# Patient Record
Sex: Female | Born: 1955 | Race: White | Hispanic: No | Marital: Single | Smoking: Never smoker
Health system: Southern US, Community
[De-identification: ages and names within clinical notes are randomized; demographics above are authoritative.]

## PROBLEM LIST (undated history)

## (undated) DIAGNOSIS — F32A Depression, unspecified: Secondary | ICD-10-CM

## (undated) DIAGNOSIS — F329 Major depressive disorder, single episode, unspecified: Secondary | ICD-10-CM

## (undated) HISTORY — PX: BREAST EXCISIONAL BIOPSY: SUR124

## (undated) HISTORY — PX: ABDOMINAL HYSTERECTOMY: SHX81

## (undated) HISTORY — PX: AUGMENTATION MAMMAPLASTY: SUR837

## (undated) HISTORY — PX: BREAST SURGERY: SHX581

## (undated) HISTORY — PX: RHINOPLASTY: SUR1284

---

## 2014-09-16 ENCOUNTER — Encounter (HOSPITAL_COMMUNITY): Payer: Self-pay | Admitting: Emergency Medicine

## 2014-09-16 ENCOUNTER — Emergency Department (INDEPENDENT_AMBULATORY_CARE_PROVIDER_SITE_OTHER)
Admission: EM | Admit: 2014-09-16 | Discharge: 2014-09-16 | Disposition: A | Discharge status: Home / Self Care | Payer: Self-pay | Source: Home / Self Care | Attending: Family Medicine | Admitting: Family Medicine

## 2014-09-16 MED ORDER — METAXALONE 800 MG PO TABS: 800.0000 mg | ORAL_TABLET | Freq: Three times a day (TID) | ORAL | Status: AC

## 2014-09-16 NOTE — Discharge Instructions (Signed)
Heat, stretch and medication as needed
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
## 2014-09-17 ENCOUNTER — Encounter (HOSPITAL_COMMUNITY): Payer: Self-pay | Admitting: Emergency Medicine

## 2014-09-17 ENCOUNTER — Emergency Department (HOSPITAL_COMMUNITY)
Admission: EM | Admit: 2014-09-17 | Discharge: 2014-09-17 | Disposition: A | Discharge status: Home / Self Care | Payer: No Typology Code available for payment source | Attending: Emergency Medicine | Admitting: Emergency Medicine

## 2014-09-17 ENCOUNTER — Emergency Department (HOSPITAL_COMMUNITY): Payer: No Typology Code available for payment source

## 2014-09-17 DIAGNOSIS — M533 Sacrococcygeal disorders, not elsewhere classified: Secondary | ICD-10-CM

## 2014-09-17 DIAGNOSIS — S199XXA Unspecified injury of neck, initial encounter: Secondary | ICD-10-CM | POA: Diagnosis present

## 2014-09-17 DIAGNOSIS — Y9389 Activity, other specified: Secondary | ICD-10-CM | POA: Diagnosis not present

## 2014-09-17 DIAGNOSIS — S161XXA Strain of muscle, fascia and tendon at neck level, initial encounter: Secondary | ICD-10-CM

## 2014-09-17 DIAGNOSIS — Y92410 Unspecified street and highway as the place of occurrence of the external cause: Secondary | ICD-10-CM | POA: Diagnosis not present

## 2014-09-17 DIAGNOSIS — M545 Low back pain, unspecified: Secondary | ICD-10-CM

## 2014-09-17 DIAGNOSIS — F329 Major depressive disorder, single episode, unspecified: Secondary | ICD-10-CM | POA: Diagnosis not present

## 2014-09-17 DIAGNOSIS — Y998 Other external cause status: Secondary | ICD-10-CM | POA: Diagnosis not present

## 2014-09-17 DIAGNOSIS — S3992XA Unspecified injury of lower back, initial encounter: Secondary | ICD-10-CM | POA: Insufficient documentation

## 2014-09-17 HISTORY — DX: Major depressive disorder, single episode, unspecified: F32.9

## 2014-09-17 HISTORY — DX: Depression, unspecified: F32.A
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
## 2014-09-17 NOTE — Discharge Instructions (Signed)
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 [place] [phone]   [facility] [place] [address] [place] [address], [place] or [address] [phone]   [facility] [address], [place] [phone]) [phone], phone; [phone]) [phone], fax Sees patients 1st and 3rd Saturday of every month.  Must not qualify for public or private insurance (i.e. Medicaid, Medicare, [place] Health Choice, Veterans' Benefits)  Household income should be no more than 200% of the poverty level The clinic cannot treat you if you are pregnant or think you are pregnant  Sexually transmitted diseases are not treated  at the clinic.    Dental Care: Organization         Address  Phone  Notes  [facility] [address] [place], [place] [phone] Accepts children up to age 19 who are enrolled in [place] or [place] Health Choice; pregnant women with a Medicaid card; and  children who have applied for Medicaid or [place] Health Choice, but were declined, whose parents can pay a reduced fee at time of service.  [facility]  [address], [place] [phone] Accepts children up to age 61 who are enrolled in [place] or [place] Health Choice; pregnant women with a Medicaid card; and children who have applied for Medicaid or [place] Health Choice, but were declined, whose parents can pay a reduced fee at time of service.  [facility]  [address] [place], [place] [phone] Patients are seen by appointment only. Walk-ins are not accepted. [facility] will see patients 29 years of age and older. Monday - Tuesday (8am-5pm) Most Wednesdays (8:30-5pm) $30 per visit, cash only  [facility]  [address], [facility] [phone] Patients are seen by appointment only. Walk-ins are not accepted. [facility] will see patients 71 years of age and older. One Wednesday Evening (Monthly: Volunteer Based).  $30 per visit, cash only  [employer]  [phone] for adults; Children under age 25, call Graduate Pediatric Dentistry at [phone]. Children aged 83-14, please call [phone] to request a pediatric application.  Dental services are provided in all areas of dental care including fillings, crowns and bridges, complete and partial dentures, implants, gum treatment, root canals, and extractions. Preventive care is also provided. Treatment is provided to both adults and children. Patients are selected via a lottery and there is often a waiting list.   [facility] [address], [place]  [phone] [email or web address]   [facility] [address] [place], [place] [phone], Ext. [phone] Second and Fourth Thursday of each month, opens at 6:30 AM; Clinic ends at 9 AM.  Patients are seen on a first-come first-served  basis, and a limited number are seen during each clinic.   [facility]  [address] [place], [place] [phone]   Eligibility Requirements You must have lived in [place], [place], or [place] counties for at least the last three months.   You cannot be eligible for state or federal sponsored [place], including [employer], [place], or [employer].   You generally cannot be eligible for healthcare insurance through your employer.    How to apply: Eligibility screenings are held every Tuesday and Wednesday afternoon from 1:00 pm until 4:00 pm. You do not need an appointment for the interview!  [facility] [address] [place], [place] [phone]   [facility]  [phone]   [facility] Health Department  [phone]   [facility]  [phone]    Behavioral Health Resources in the Community: Intensive Outpatient Programs Organization         Address  Phone  Notes  [facility] [address] [place], [place] [phone]   [facility] [address] [place], [place] [phone]   [facility] [address] [place], [place]  [phone]   [facility]  Mental Health [address],  [place], [place] [phone] or [phone]   Substance Abuse Resources Organization         Address  Phone  Notes  [facility] Drug Services  [phone]   [facility]  [phone]   [facility]  [phone]   Floydene Flock  [phone]   Residential & Outpatient Substance Abuse Program  [phone]   Psychological Services Organization         Address  Phone  Notes  [facility] Behavioral Health  [phone]   [facility]  [phone]   [facility] [address] [place] [phone] or [phone]    Mobile Crisis Teams Organization          Address  Phone  Notes  [facility], Mobile Crisis Care Unit  [phone]   [facility]  [address] [place], [place] [phone]   Doristine Locks [address] [place] [phone]    Self-Help/Support Groups Organization         Address  Phone             Notes  [facility]oc[facility] [place] - variety of support groups  [phone]- [phone] Call for more information  Narcotics Anonymous (NA), [facility] [address], [employer] [place]  2 meetings at this location   Statistician         Address  Phone  Notes  [facility] [address],    [place]  [phone]   [facility]  [address] [place] [address], [place], [place] [phone]   [facility] [address] [place], [place] [phone] Admissions: 8am-3pm M-F  [facility] [address],    [place], [place] [phone]   [facility] [address] [place], [place], [place] [phone]   [facility] [address],  [place], [place] [phone]   [facility] - Intensive Outpatient [address], [place], [place] [phone]   [facility] [address] [place].,  [place], [place] [phone] or [phone]   [facility] ([facility]) [address] [place], [place] [phone] Accepts Medicaid  [facility] [place] [address]  [place] [phone] Substance Abuse/Addiction Treatment   [facility] Organization         Address  Phone  Notes  [facility]  [phone]   Angie Fava, PhD [address] [place], [place]   [phone] or [phone]   [facility] Behavioral   [address] [place], [place] [phone]   [facility] [address] [place], [place] [phone] Insurance/Medicaid/sponsorship  through [facility] and Families [address]                                    [place], [place] [phone] Therapy/tele-psych/case  [facility] [address], [place] [phone]    Dr. Lolly Mustache  [phone]   [facility]  [employer] [facility] Dept. 1) [address] [place] 2) [address], [place] 3)  [address], [place] [phone]  [phone]   [facility] Child Abuse Hotline ([phone] or [phone] (After Hours)
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
## 2015-06-22 ENCOUNTER — Other Ambulatory Visit: Payer: Self-pay

## 2015-06-22 ENCOUNTER — Ambulatory Visit
Admission: RE | Admit: 2015-06-22 | Discharge: 2015-06-22 | Disposition: A | Discharge status: Home / Self Care | Payer: PRIVATE HEALTH INSURANCE | Source: Ambulatory Visit

## 2015-06-22 DIAGNOSIS — Z1231 Encounter for screening mammogram for malignant neoplasm of breast: Secondary | ICD-10-CM

## 2016-11-23 ENCOUNTER — Other Ambulatory Visit: Payer: Self-pay | Admitting: Obstetrics and Gynecology

## 2016-11-23 DIAGNOSIS — Z1231 Encounter for screening mammogram for malignant neoplasm of breast: Secondary | ICD-10-CM

## 2016-11-24 ENCOUNTER — Ambulatory Visit
Admission: RE | Admit: 2016-11-24 | Discharge: 2016-11-24 | Disposition: A | Discharge status: Home / Self Care | Payer: PRIVATE HEALTH INSURANCE | Source: Ambulatory Visit | Attending: Obstetrics and Gynecology | Admitting: Obstetrics and Gynecology

## 2016-11-24 DIAGNOSIS — Z1231 Encounter for screening mammogram for malignant neoplasm of breast: Secondary | ICD-10-CM

## 2018-01-23 ENCOUNTER — Other Ambulatory Visit: Payer: Self-pay | Admitting: Internal Medicine

## 2018-01-23 DIAGNOSIS — Z1231 Encounter for screening mammogram for malignant neoplasm of breast: Secondary | ICD-10-CM

## 2018-01-25 ENCOUNTER — Ambulatory Visit
Admission: RE | Admit: 2018-01-25 | Discharge: 2018-01-25 | Disposition: A | Discharge status: Home / Self Care | Payer: PRIVATE HEALTH INSURANCE | Source: Ambulatory Visit | Attending: Internal Medicine | Admitting: Internal Medicine

## 2018-01-25 DIAGNOSIS — Z1231 Encounter for screening mammogram for malignant neoplasm of breast: Secondary | ICD-10-CM

## 2020-05-31 IMAGING — MG DIGITAL SCREENING BILATERAL MAMMOGRAM WITH IMPLANTS, CAD AND TOM
9 of 12 series · 9 of 28 positions shown · non-contrast
Comparison: Previous exam(s).

CLINICAL DATA: Screening.

EXAM:
DIGITAL SCREENING BILATERAL MAMMOGRAM WITH IMPLANTS, CAD AND TOMO
The patient has bilateral subpectoral implants. Standard and implant
displaced views were performed.

[R CC]
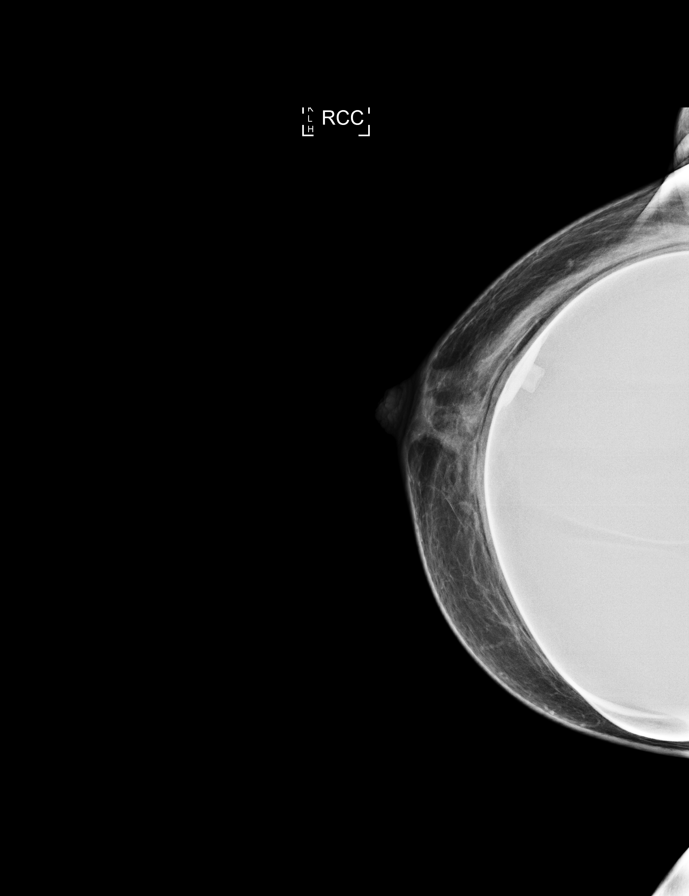

[L CC]
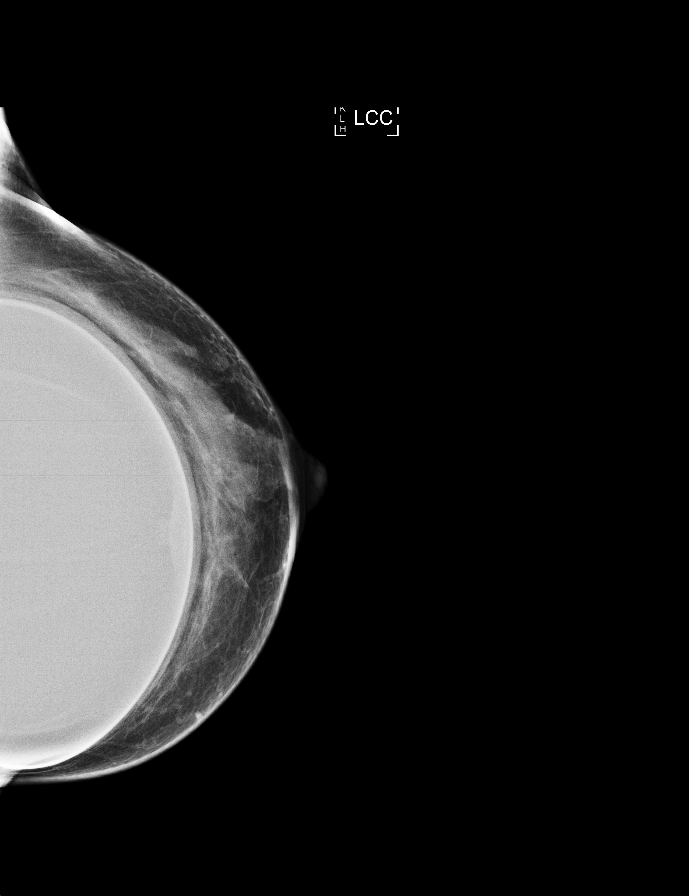

[R MLO]
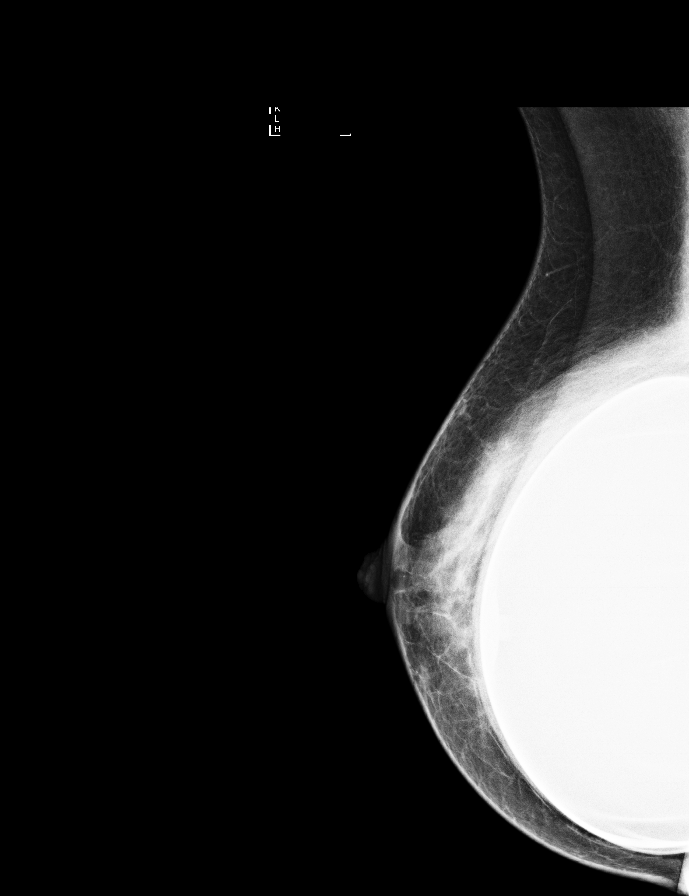

[L MLO]
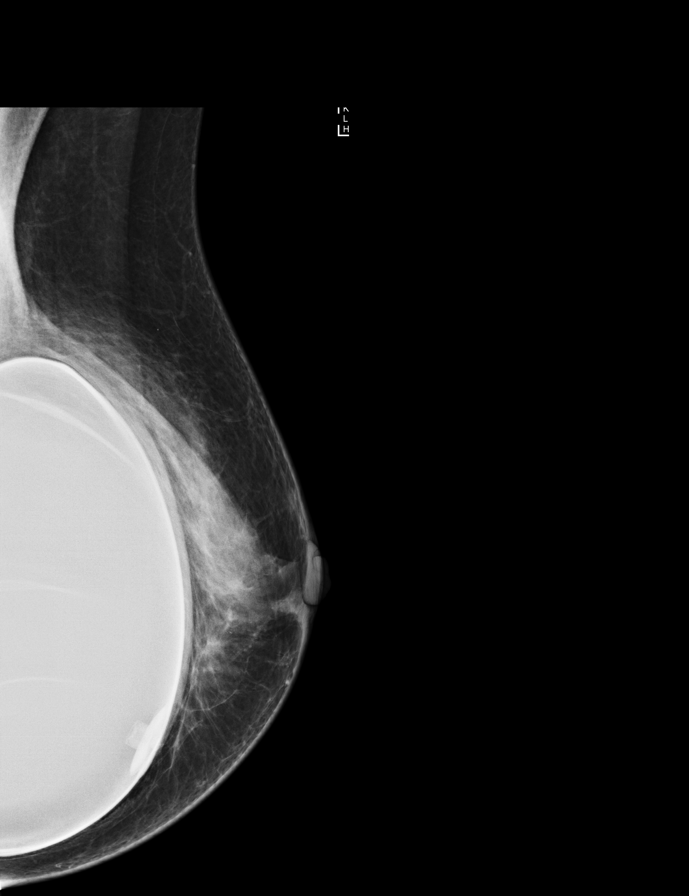

[R CC synth-2D]
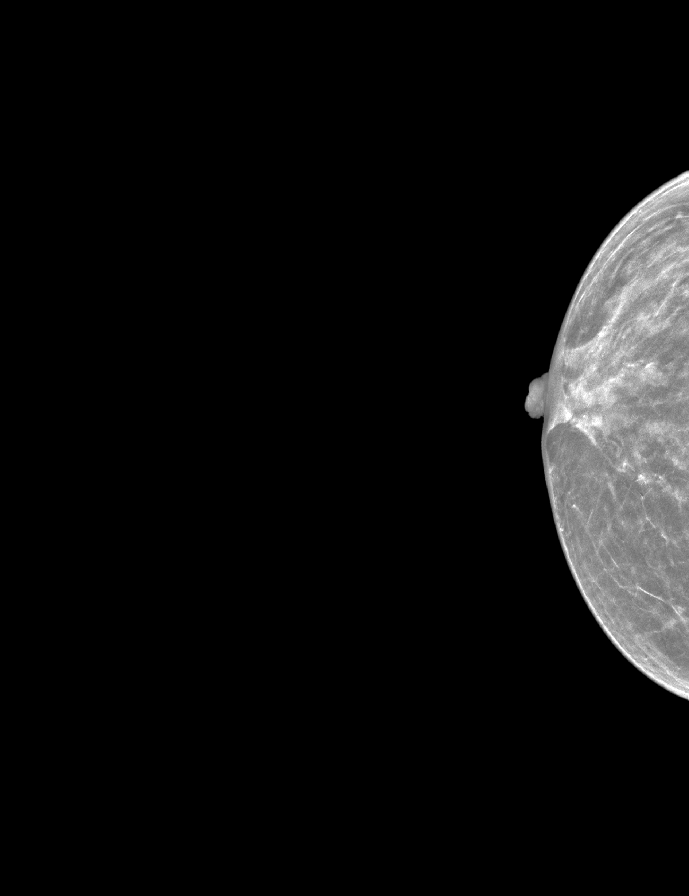

[L CC synth-2D]
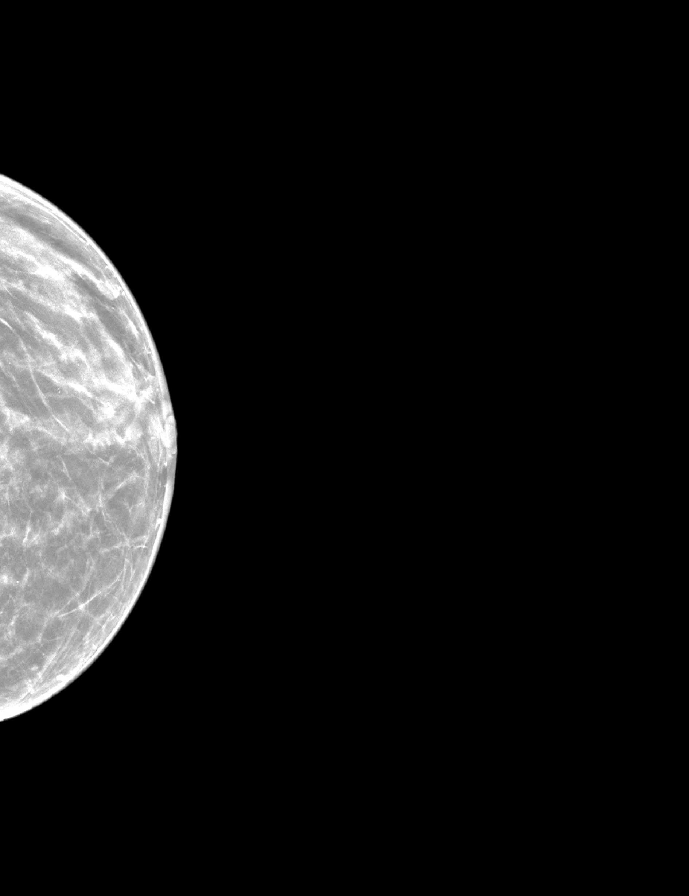

[R MLO synth-2D]
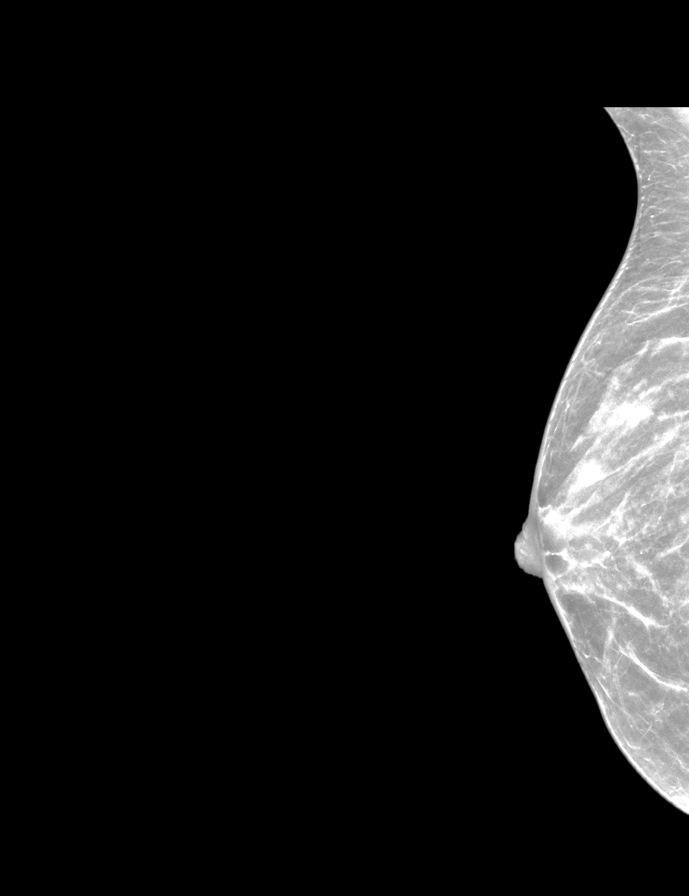

[L MLO synth-2D]
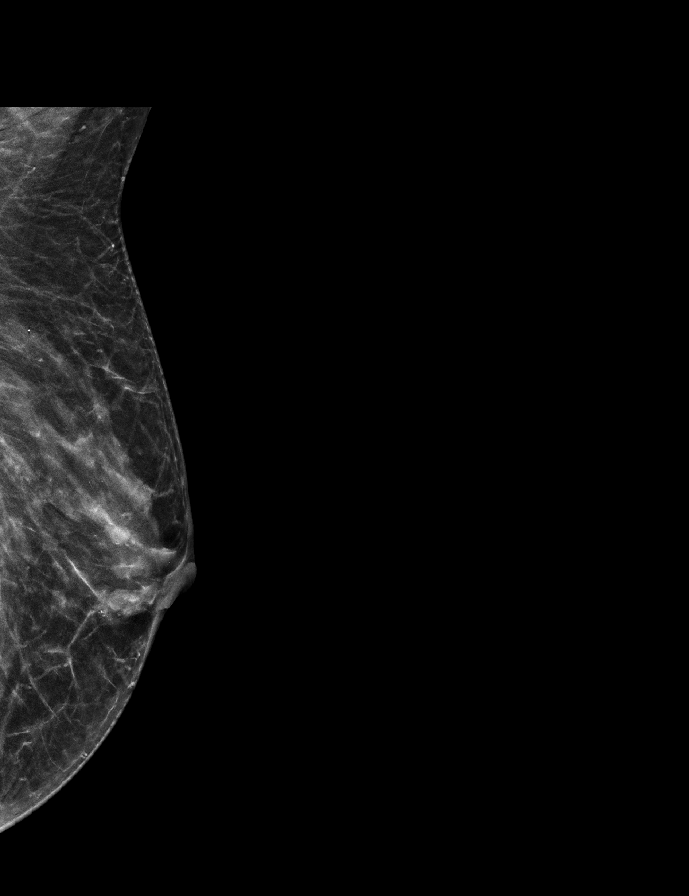

[L MLOID BREAST TOMOSYNTHESIS IMAGE tomo · tomo slice 24/47.0]
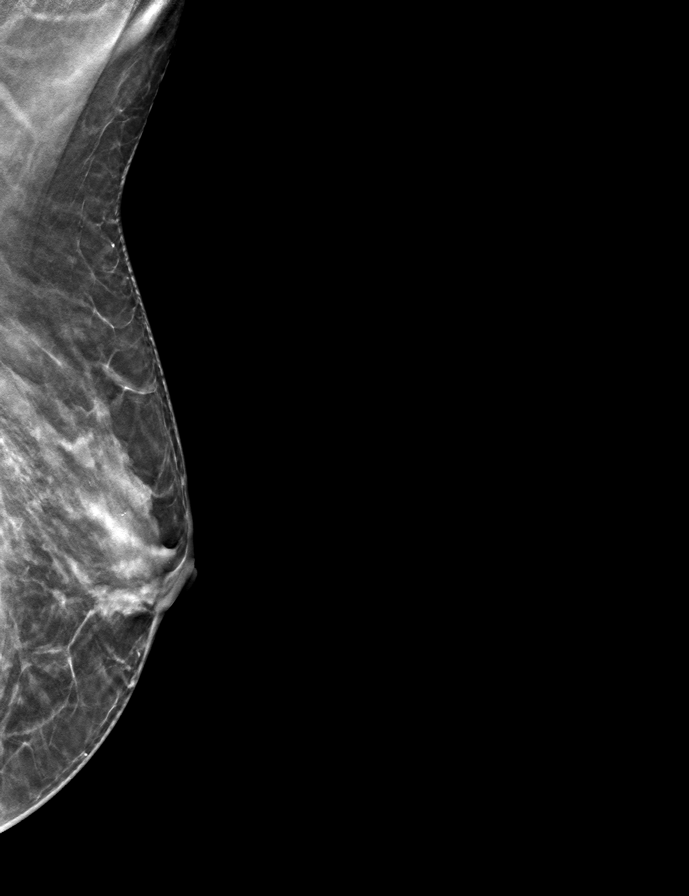

[9 of 28 positions shown; findings below may reference images not displayed]

ACR Breast Density Category c: The breast tissue is heterogeneously
dense, which may obscure small masses.
FINDINGS: There are no findings suspicious for malignancy. Images were
processed with CAD.
IMPRESSION: No mammographic evidence of malignancy. A result letter of this
screening mammogram will be mailed directly to the patient.

RECOMMENDATION:
Screening mammogram in one year. (Code:HT-[id number])

BI-RADS CATEGORY  1:  Negative.
